# Patient Record
Sex: Male | Born: 1970 | Race: Black or African American | Hispanic: No | Marital: Married
Health system: Southern US, Community
[De-identification: ages and names within clinical notes are randomized; demographics above are authoritative.]

---

## 2014-01-15 ENCOUNTER — Inpatient Hospital Stay: Payer: Self-pay | Admitting: Internal Medicine

## 2014-01-15 LAB — COMPREHENSIVE METABOLIC PANEL
ALK PHOS: 193 U/L — AB
ANION GAP: 6 — AB (ref 7–16)
Albumin: 2.8 g/dL — ABNORMAL LOW (ref 3.4–5.0)
BUN: 12 mg/dL (ref 7–18)
Bilirubin,Total: 1.1 mg/dL — ABNORMAL HIGH (ref 0.2–1.0)
Calcium, Total: 8.7 mg/dL (ref 8.5–10.1)
Chloride: 92 mmol/L — ABNORMAL LOW (ref 98–107)
Co2: 27 mmol/L (ref 21–32)
Creatinine: 1.58 mg/dL — ABNORMAL HIGH (ref 0.60–1.30)
EGFR (African American): >60
EGFR (Non-African Amer.): 54 — ABNORMAL LOW
Glucose: 99 mg/dL (ref 65–99)
OSMOLALITY: 251 (ref 275–301)
Potassium: 5 mmol/L (ref 3.5–5.1)
SGOT(AST): 228 U/L — ABNORMAL HIGH (ref 15–37)
SGPT (ALT): 349 U/L — ABNORMAL HIGH (ref 12–78)
Sodium: 125 mmol/L — ABNORMAL LOW (ref 136–145)
TOTAL PROTEIN: 8.6 g/dL — AB (ref 6.4–8.2)

## 2014-01-15 LAB — CBC WITH DIFFERENTIAL/PLATELET
Basophil #: 0.1 10*3/uL (ref 0.0–0.1)
Basophil %: 0.7 %
Eosinophil #: 0.1 10*3/uL (ref 0.0–0.7)
Eosinophil %: 0.6 %
HCT: 39.7 % — AB (ref 40.0–52.0)
HGB: 12.9 g/dL — AB (ref 13.0–18.0)
Lymphocyte #: 1.6 10*3/uL (ref 1.0–3.6)
Lymphocyte %: 9.1 %
MCH: 29 pg (ref 26.0–34.0)
MCHC: 32.5 g/dL (ref 32.0–36.0)
MCV: 89 fL (ref 80–100)
MONOS PCT: 16.7 %
Monocyte #: 2.9 x10 3/mm — ABNORMAL HIGH (ref 0.2–1.0)
NEUTROS ABS: 12.4 10*3/uL — AB (ref 1.4–6.5)
NEUTROS PCT: 72.9 %
Platelet: 443 10*3/uL — ABNORMAL HIGH (ref 150–440)
RBC: 4.45 10*6/uL (ref 4.40–5.90)
RDW: 14.7 % — AB (ref 11.5–14.5)
WBC: 17.1 10*3/uL — ABNORMAL HIGH (ref 3.8–10.6)

## 2014-01-16 LAB — URINALYSIS, COMPLETE
BILIRUBIN, UR: NEGATIVE
Bacteria: NONE SEEN
Glucose,UR: NEGATIVE mg/dL (ref 0–75)
Ketone: NEGATIVE
Leukocyte Esterase: NEGATIVE
Nitrite: NEGATIVE
Ph: 7 (ref 4.5–8.0)
Protein: NEGATIVE
RBC,UR: 1 /HPF (ref 0–5)
Specific Gravity: 1.004 (ref 1.003–1.030)
Squamous Epithelial: NONE SEEN
WBC UR: 3 /HPF (ref 0–5)

## 2014-01-17 LAB — BASIC METABOLIC PANEL
ANION GAP: 3 — AB (ref 7–16)
BUN: 9 mg/dL (ref 7–18)
Calcium, Total: 8.4 mg/dL — ABNORMAL LOW (ref 8.5–10.1)
Chloride: 101 mmol/L (ref 98–107)
Co2: 25 mmol/L (ref 21–32)
Creatinine: 1.17 mg/dL (ref 0.60–1.30)
EGFR (African American): >60
Glucose: 96 mg/dL (ref 65–99)
OSMOLALITY: 257 (ref 275–301)
POTASSIUM: 5 mmol/L (ref 3.5–5.1)
SODIUM: 129 mmol/L — AB (ref 136–145)

## 2014-01-17 LAB — HEPATIC FUNCTION PANEL A (ARMC)
ALBUMIN: 2.4 g/dL — AB (ref 3.4–5.0)
ALK PHOS: 159 U/L — AB
BILIRUBIN DIRECT: 0.1 mg/dL (ref 0.00–0.20)
Bilirubin,Total: 0.5 mg/dL (ref 0.2–1.0)
SGOT(AST): 131 U/L — ABNORMAL HIGH (ref 15–37)
SGPT (ALT): 285 U/L — ABNORMAL HIGH (ref 12–78)
Total Protein: 6.9 g/dL (ref 6.4–8.2)

## 2014-01-20 LAB — CULTURE, BLOOD (SINGLE)

## 2014-11-21 NOTE — Discharge Summary (Signed)
PATIENT NAME:  Steven Swanson, Steven Swanson MR#:  [id number] DATE OF BIRTH:  08/22/1970  DATE OF ADMISSION:  01/15/2014 DATE OF DISCHARGE:  01/17/2014  ADMITTING PHYSICIAN:  Dr. Alan Ripperavid Howard.  DISCHARGING PHYSICIAN: Enid Baasadhika Kalisetti, MD.  PRIMARY CARE PHYSICIAN:  None.  CONSULTATIONS IN THE HOSPITAL: None.   DISCHARGE DIAGNOSES:  1.  Sepsis.  2.  Left-sided multilobar pneumonia.  3.  Transaminitis.  4.  Hyponatremia.  5.  Acute renal failure.  6.  Hypertension, transient, in the hospital.   DISCHARGE HOME MEDICATIONS:  Levaquin 500 mg p.o. daily for 8 days.   DISCHARGE DIET: Low-sodium diet.   DISCHARGE ACTIVITY: As tolerated.   FOLLOWUP INSTRUCTIONS:  1.  PCP follow-up in 1 week.  2.  LFTs follow in one week.   LABORATORY DATA AND IMAGING STUDIES PRIOR TO DISCHARGE: Sodium 129, potassium 5.0, chloride 101, bicarbonate 25, BUN 9, creatinine 1.17, glucose 96, and calcium of 8.4.   Total bilirubin 0.5, alkaline phosphatase 159, ALT 285, AST 131.   Ultrasound of the abdomen showing normal liver. No abnormalities.   Hepatitis panel negative. Blood cultures are negative.  Chest x-ray showing left-sided multilobar pneumonia. Normal heart size. No acute osseous abnormalities noted.   WBC on admission was 17.1, creatinine 1.58, sodium 125, chloride of 92.  Urinalysis negative for any infection.   BRIEF HOSPITAL COURSE:  1.  Steven Swanson is a 44 year old African American male with no significant past medical history, presented to the hospital secondary to fever, white count, and tachycardia.  He was noted to be septic secondary to multilobar pneumonia based on chest x-ray.  He was started on Rocephin and azithromycin. Blood cultures are negative. He has improved a lot. He is able to saturate well on room air, ambulate without any desaturations, and is being discharged home on Levaquin.  2.  Hyponatremia.  3.  Acute renal failure secondary to prerenal causes, dehydration, which improved a lot  since admission.  4.  Transaminitis, likely from underlying infection and fever. No cause has been found.  His LFTS are improving with improvement in his infection. The ultrasound showed normal liver and hepatitis panel is negative. He is advised to have follow-up of his liver tests in one week.  5.  His course has been, otherwise, uneventful in the hospital.   DISCHARGE CONDITION: Stable.   DISCHARGE DISPOSITION: Home.  TIME SPENT ON DISCHARGE: 40 minutes.    ____________________________ Enid Baasadhika Kalisetti, MD rk:ts D: 01/17/2014 12:44:33 ET T: 01/17/2014 13:25:37 ET JOB#: [id number]  cc: Enid Baasadhika Kalisetti, MD, <Dictator> Enid BaasADHIKA KALISETTI MD ELECTRONICALLY SIGNED 01/19/2014 16:14

## 2014-11-21 NOTE — H&P (Signed)
PATIENT NAME:  Steven Swanson, Steven Swanson MR#:  [id number] DATE OF BIRTH:  10-17-1970  DATE OF ADMISSION:  01/15/2014  REFERRING PHYSICIAN:  Dr. Jimmye Norman.  PRIMARY CARE PHYSICIAN:  None.   CHIEF COMPLAINT:  Fever.   HISTORY OF PRESENT ILLNESS:  A 44 year old African American gentleman without significant past medical history presenting with fever.  He states that he has about one week duration of fever, chills.  He said originally at the beginning of the week he had an ear infection due to left ear pain which he described as achy.  Denies any tinnitus, changes in hearing or discharge.  He took some amoxicillin that "he had laying around."  His ear symptoms have resolved, however his fever, chills, did not improve.  He now has associated fatigue and weakness as well as noting an associated cough for the last 3 to 4 days duration which is productive in nature of yellowish sputum.  Denies any sick contacts.  Denies any chest pain, palpitations, or shortness of breath.  Denies any further symptomatology.   REVIEW OF SYSTEMS:  CONSTITUTIONAL:  Positive for fevers, chills, fatigue, weakness.  EYES:  Denies blurred vision, double vision, eye pain.  EARS, NOSE, THROAT:  Denies tinnitus or hearing loss.  Positive for ear pain as described above.  Denies any discharge.  RESPIRATORY:  Positive for cough as described above.  Denies any wheezing, hemoptysis or shortness of breath.  CARDIOVASCULAR:  Denies any chest pain, palpitations, edema.  GASTROINTESTINAL:  Denies nausea, vomiting, diarrhea, abdominal pain.  GENITOURINARY:  Denies dysuria or hematuria.  ENDOCRINE:  Denies nocturia or thyroid problems.  HEMATOLOGIC AND LYMPHATIC:  Denies easy bruising or bleeding.  SKIN:  Denies rashes or lesions.  MUSCULOSKELETAL:  Denies pain in neck, back, shoulder, knees, hips or arthritic symptoms.  NEUROLOGIC:  Denies any paralysis or paresthesias.  PSYCHIATRIC:  Denies anxiety or depressive symptoms.  Otherwise, full  review of systems performed by me is negative.   PAST MEDICAL HISTORY:  No prior medical conditions.   SOCIAL HISTORY:  Positive for tobacco use as well as occasional alcohol use.  Denies any drug usage.   FAMILY HISTORY:  Denies any known cardiovascular or pulmonary disorders.   ALLERGIES:  No known drug allergies.   HOME MEDICATIONS:  None.  He takes no over-the-counter medications as well.   PHYSICAL EXAMINATION: VITAL SIGNS:  Temperature 103.1 degrees Fahrenheit, heart rate 107, respirations 22, blood pressure 128/74, saturating 97% on room air.  Weight 90.7 kg, BMI 25.  GENERAL:  Well-nourished, well-developed, African American gentleman, who is ill-appearing.  HEAD:  Normocephalic, atraumatic.  EYES:  Pupils equal, round, reactive to light.  Extraocular muscles intact.  No scleral icterus.  MOUTH:  Moist mucosal membranes.  Dentition intact.  No abscess noted.  EAR, NOSE, THROAT:  Clear without exudates.  Tympanic membranes are not visible secondary to impaction.  There is no erythema as noted.  No discharge.  No external lesions.  NECK:  Supple.  No thyromegaly.  No nodules.  No JVD.  PULMONARY:  Scant coarse breath sounds over the left lung base, otherwise clear to auscultation bilaterally without wheezes, rales, or rhonchi.  No use of accessory muscles.  Good respiratory effort.  CHEST:  Nontender to palpation.  CARDIOVASCULAR:  S1, S2, tachycardic.  No murmurs, rubs, or gallops.  No edema.  Pedal pulses 2+ bilaterally.  GASTROINTESTINAL:  Soft, nontender, nondistended.  No masses.  Positive bowel sounds.  No hepatosplenomegaly.  Murphy sign is negative. MUSCULOSKELETAL:  No  swelling, clubbing, or edema.  Range of motion full in all extremities.  NEUROLOGIC:  Cranial nerves II through XII intact.  No gross focal neurological deficits.  Sensation intact.  Reflexes intact.  SKIN:  No ulcerations, lesions, rashes, or cyanosis.  Skin warm and dry.  Turgor intact.  PSYCHIATRIC:  Mood  and affect within normal limits.  The patient is awake, alert, oriented x 3.  Insight and judgment intact.   LABORATORY DATA:  Sodium 125, potassium 5, chloride 92, bicarb 27, BUN 12, creatinine 1.58, glucose 99.  LFTs:  Total protein 8.6, albumin 2.8, bilirubin 1.1, alk phos 193, AST 228, ALT 349, WBC 17.1, hemoglobin 12.9, platelets of 443.  He had a chest x-ray performed revealing a left-sided multifocal pneumonia.   ASSESSMENT AND PLAN:  A 44 year old African American gentleman presenting with fever.   1.  Sepsis:  Meeting septic criteria by heart rate, respiratory rate, temperature and leukocytosis (on arrival).  Sepsis secondary to community-acquired pneumonia, panculture including blood and urine, antibiotic coverage with ceftriaxone and azithromycin, intravenous fluid hydration to keep mean arterial pressure greater than 65.  Provide DuoNeb treatments q. 4 hours as well as supplemental oxygen to keep oxygen saturation greater than 92%.  2.  Hyponatremia with a 544.2 mEq sodium deficit to correct his sodium from 125 to 135 in approximately 24 hours.  This would be normal saline 100 mL/hr.  3.  Transaminitis, unknown baseline.  He is asymptomatic at this time.  Does take occasional alcohol intake.  Denies any drug usage or high risk behavior for human immunodeficiency virus.  We will check an abdominal ultrasound to further work up his laboratory abnormalities.  4.  Acute kidney injury.  Once again, unknown baseline.  Provide intravenous fluid hydration.  Follow urine output, renal function as he does have a urinalysis that is pending at this time and his urinalysis will also check for protein given he does have hypoalbuminemia.  5.  Venous thromboembolism prophylaxis with heparin subQ.  6.  CODE STATUS:  THE PATIENT IS FULL CODE.   TIME SPENT:  45 minutes.     ____________________________ Aaron Mose. Hower, MD dkh:ea D: 01/16/2014 00:00:42 ET T: 01/16/2014 00:42:01  ET JOB#: [id number]  cc: Aaron Mose. Hower, MD, <Dictator> DAVID Woodfin Ganja MD ELECTRONICALLY SIGNED 01/16/2014 4:09

## 2016-02-10 IMAGING — US ABDOMEN ULTRASOUND LIMITED
1 series · 14 of 25 positions shown · non-contrast
Comparison: None.

CLINICAL DATA: Fever.  Elevated liver function tests.

EXAM:
US ABDOMEN LIMITED - RIGHT UPPER QUADRANT

[Series 1: abdomen ultrasound limited · 0.21mm/px · 14 of 40 slices shown]
[im 1/40]
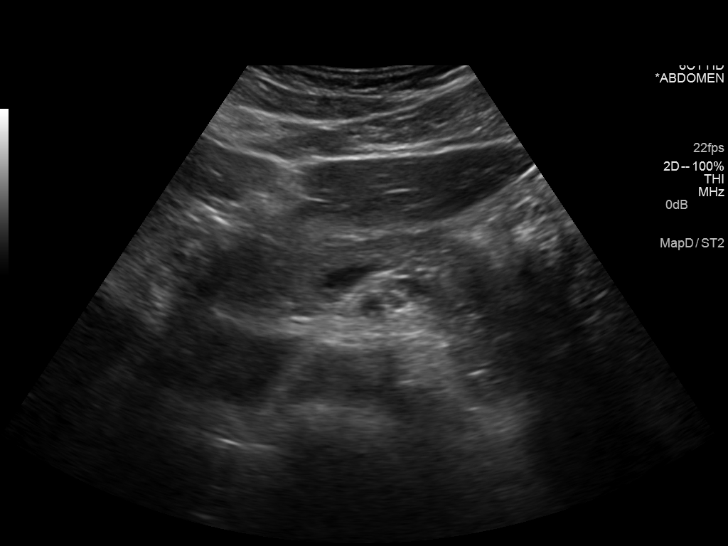
[im 4/40]
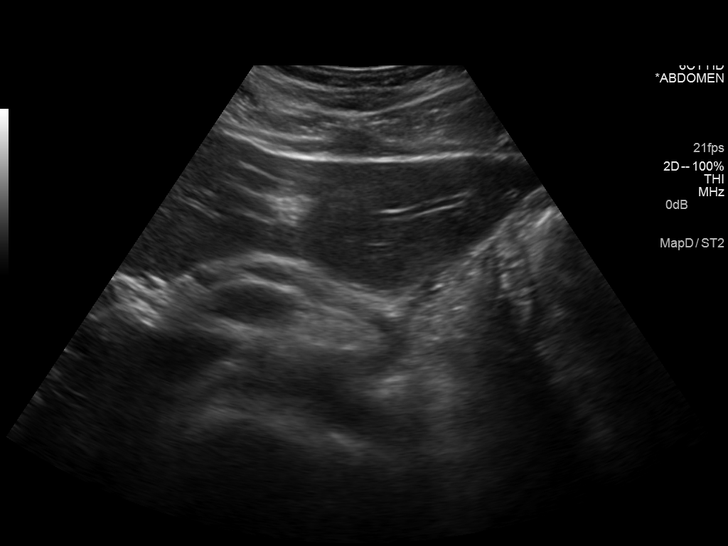
[im 7/40]
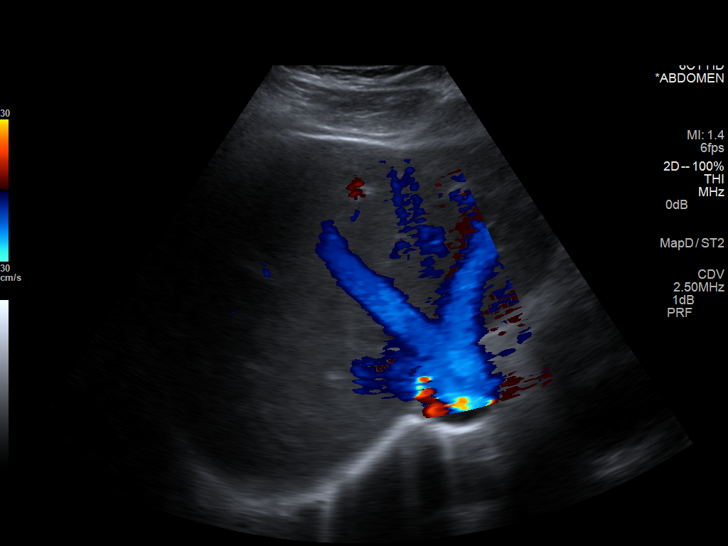
[im 10/40]
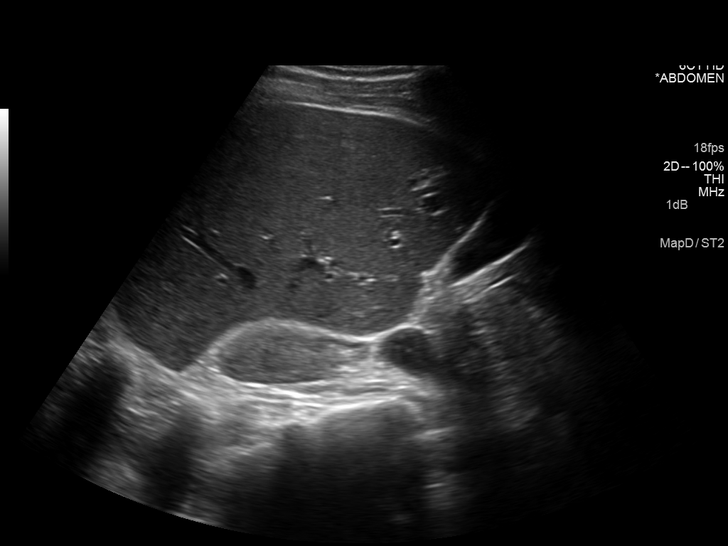
[im 14/40]
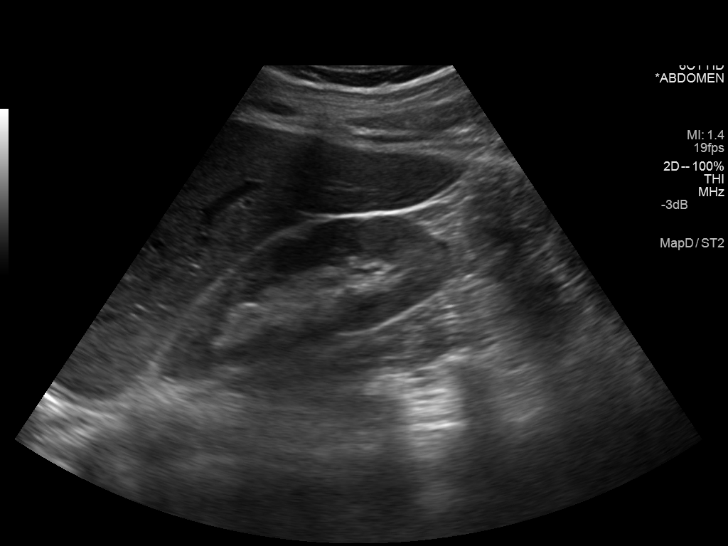
[im 15/40]
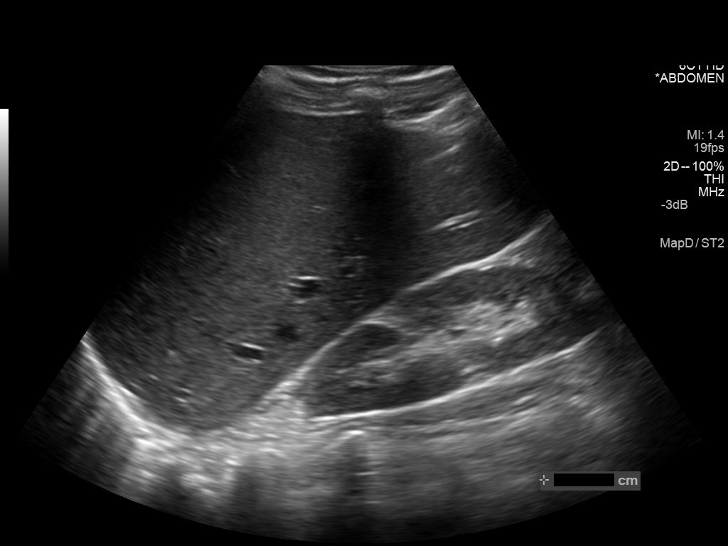
[im 18/40]
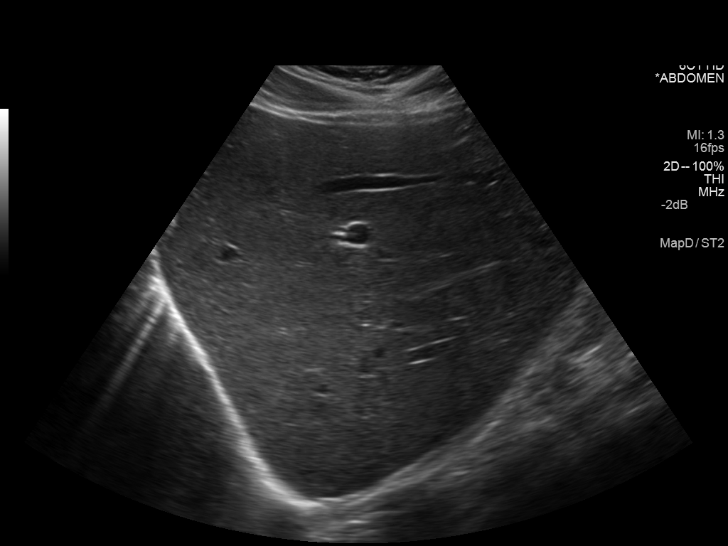
[im 22/40]
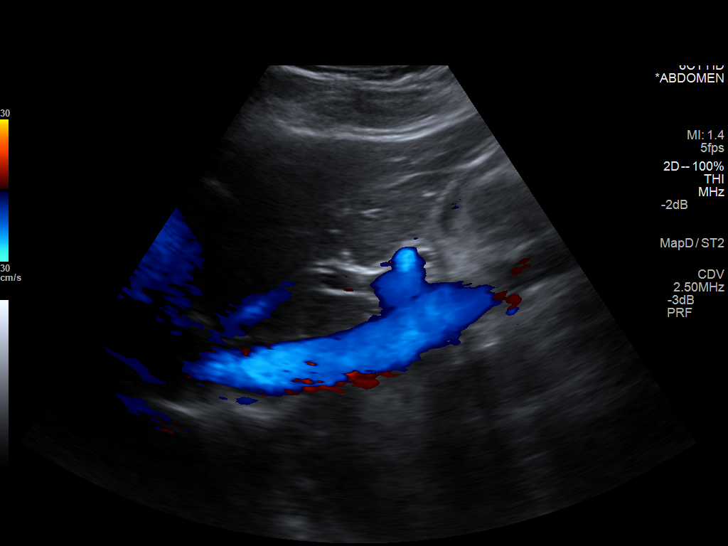
[im 25/40]
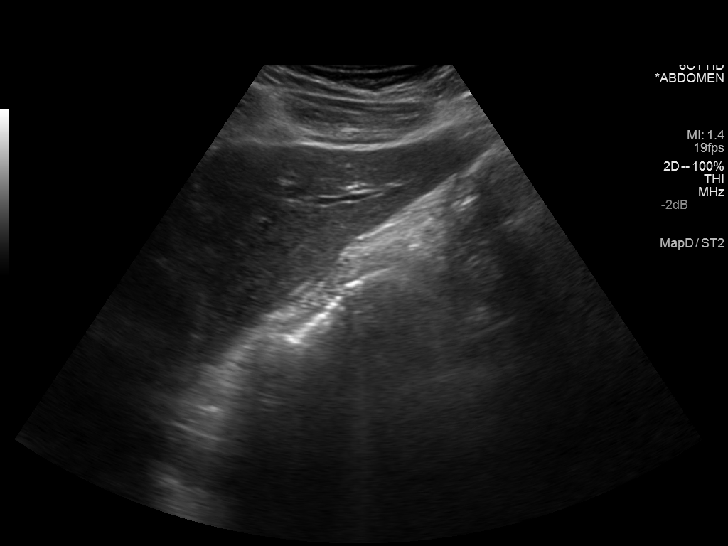
[im 27/40]
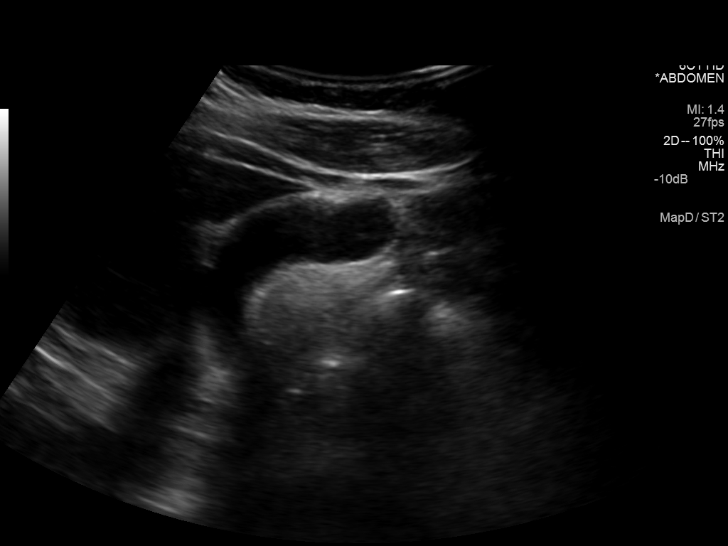
[im 30/40]
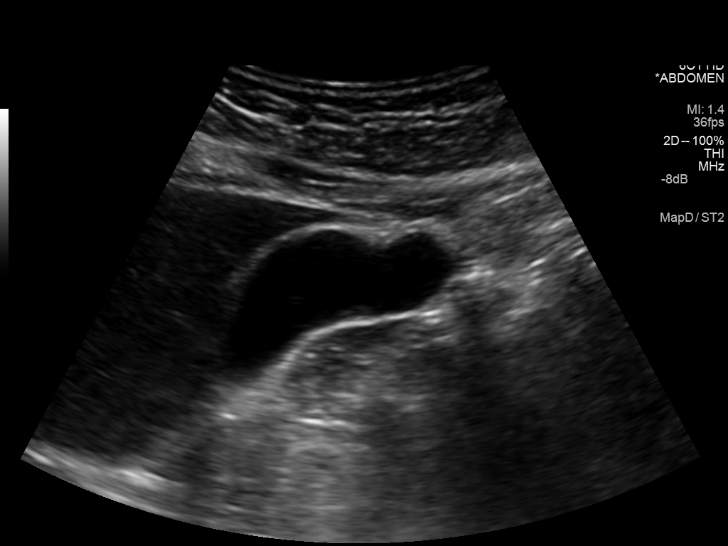
[im 33/40]
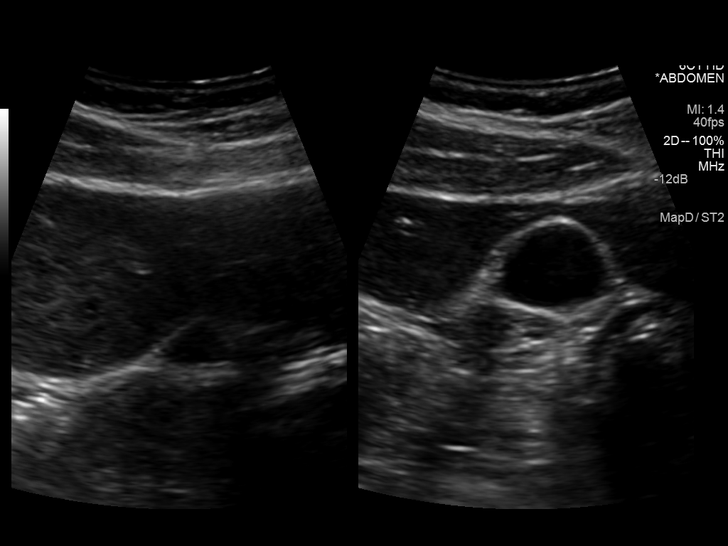
[im 36/40]
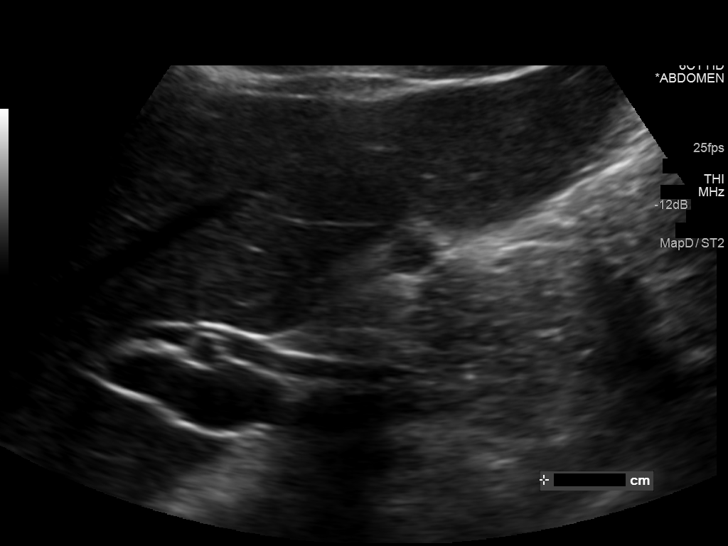
[im 40/40]
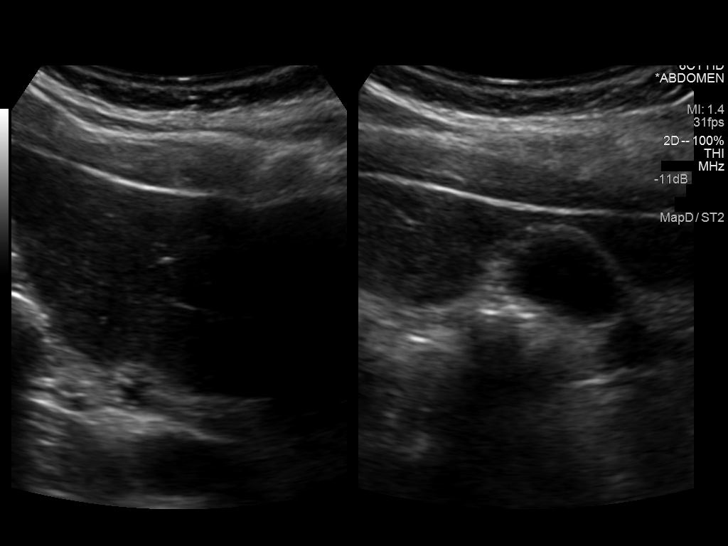

[14 of 25 positions shown; findings below may reference images not displayed]

FINDINGS: Gallbladder:

No gallstones or wall thickening visualized. No sonographic Murphy
sign noted.

Common bile duct:

Diameter: 4 mm, normal.

Liver:

No focal lesion identified. Within normal limits in parenchymal
echogenicity.
IMPRESSION: Normal.

## 2016-02-10 IMAGING — CR DG CHEST 2V
1 series · 2 of 2 positions shown · non-contrast
Comparison: None.

CLINICAL DATA: Fever.  Intermittent cough.

EXAM:
CHEST  2 VIEW

[Series 1: w chest pa · 0.14mm/px · 2 of 2 slices shown]
[im 1/2]
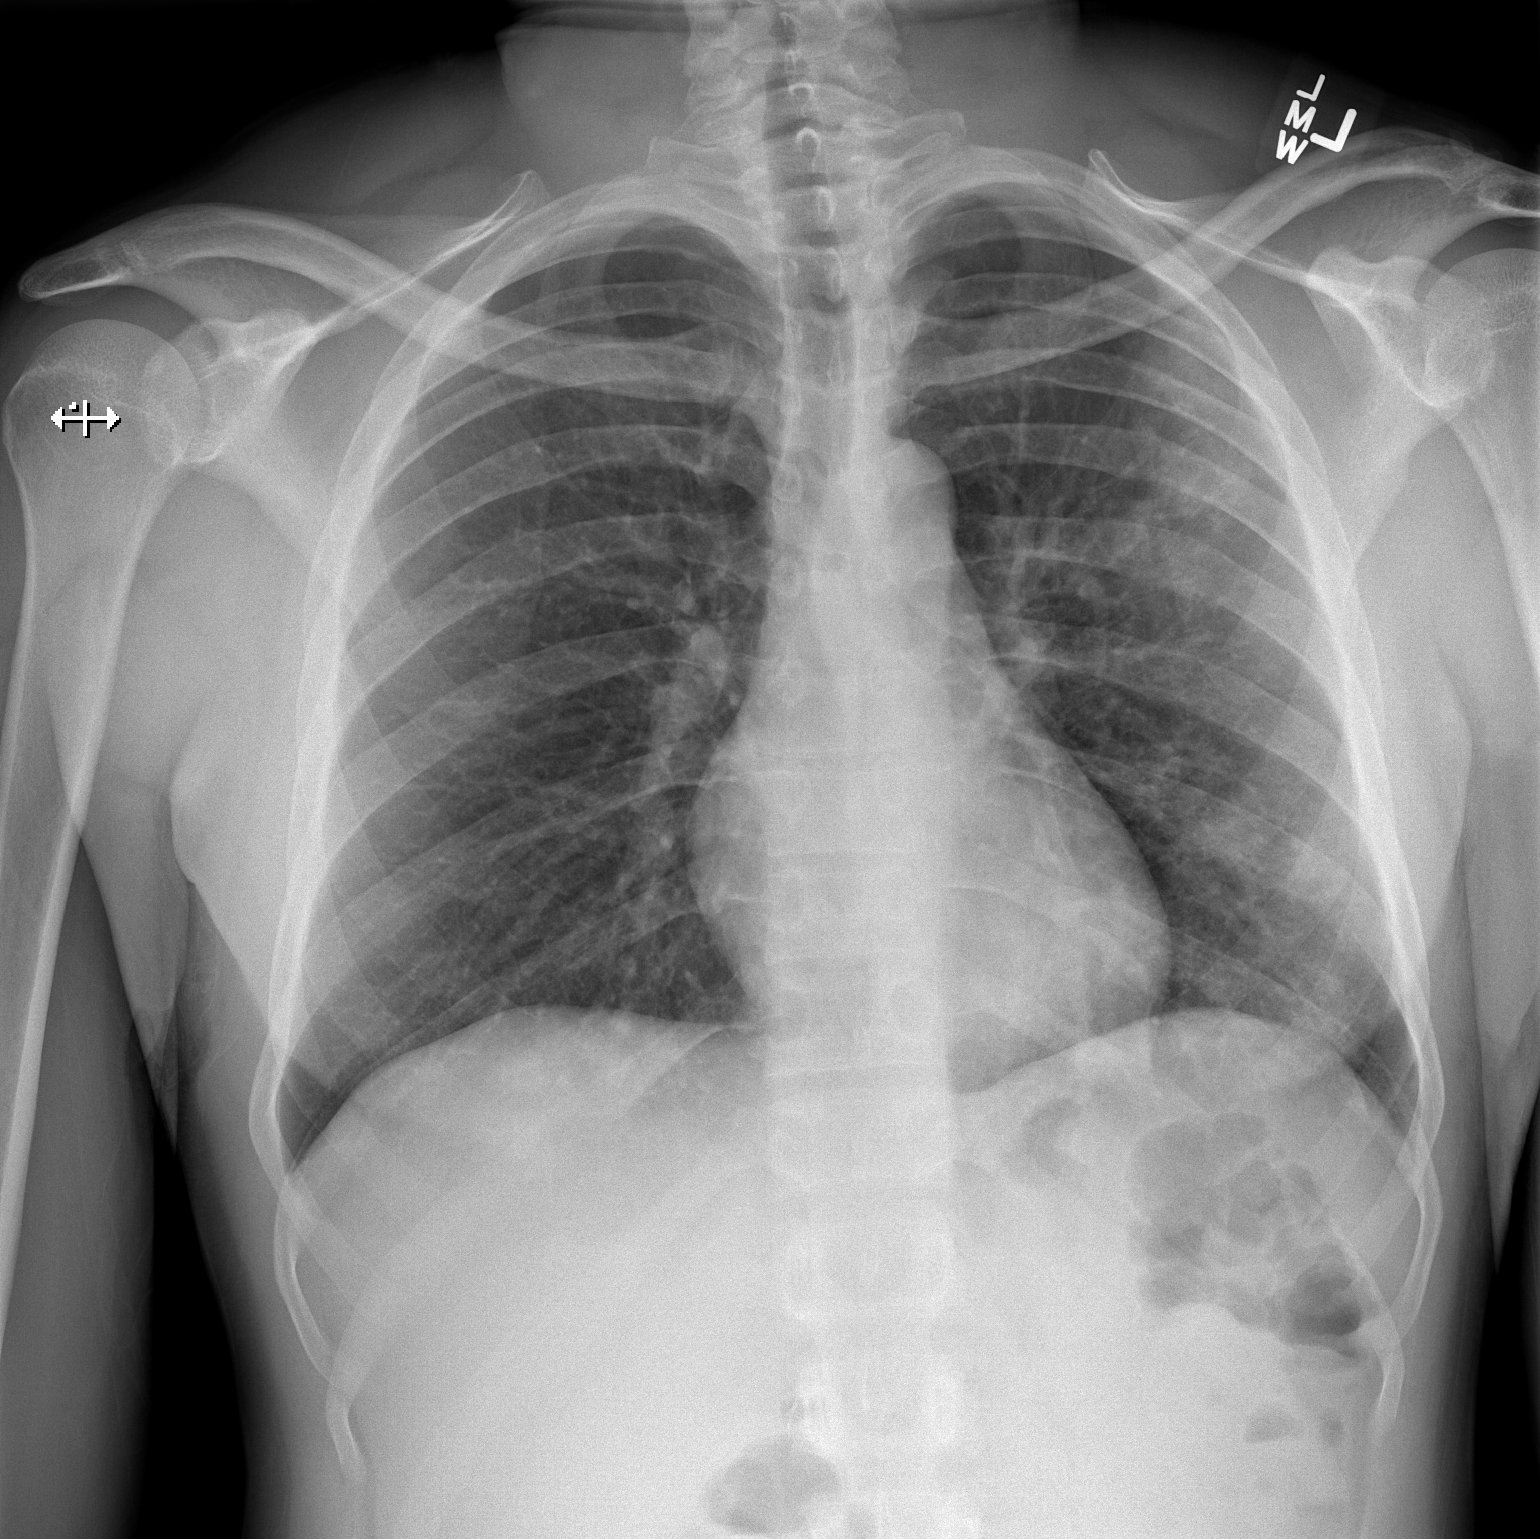
[im 2/2]
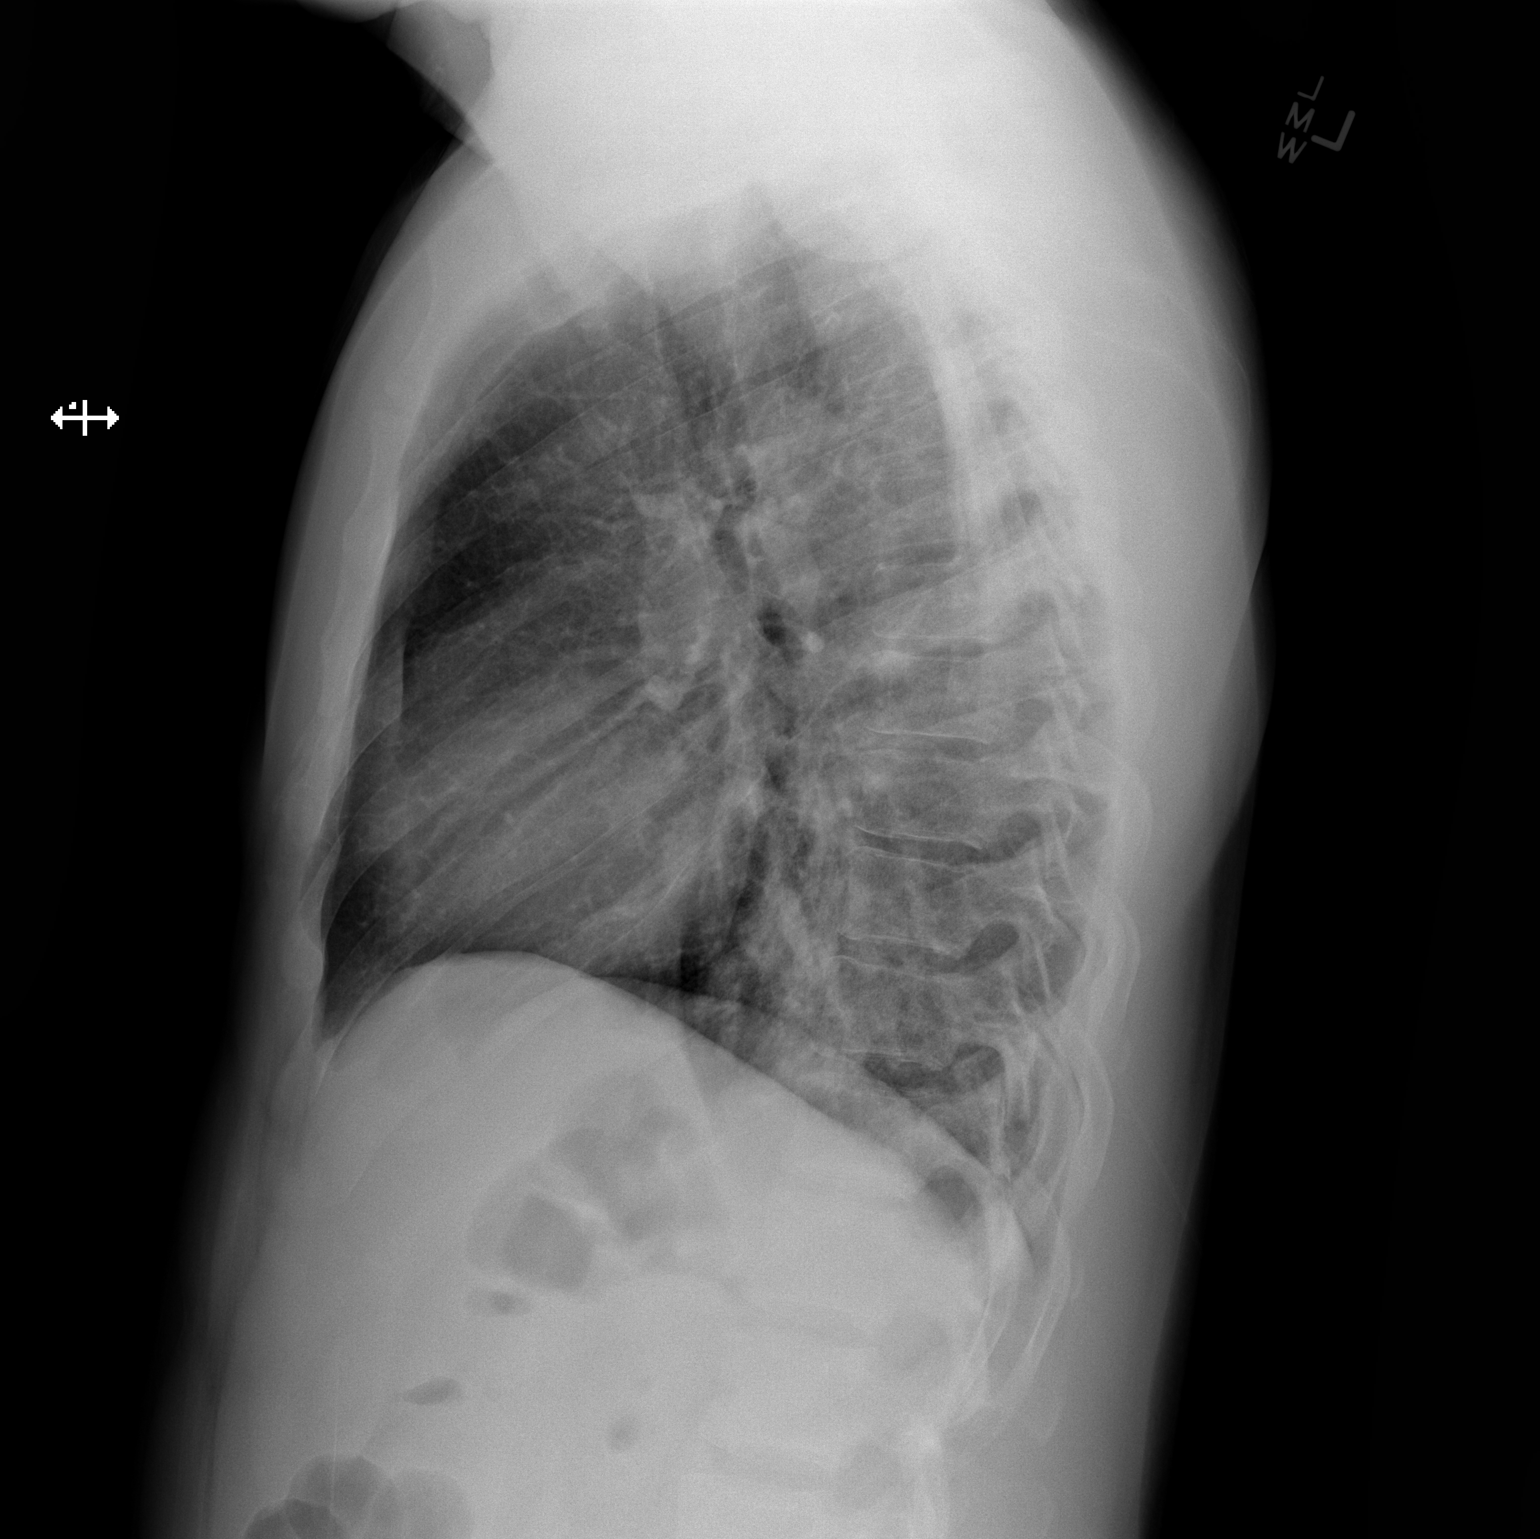

[2 of 2 positions shown; findings below may reference images not displayed]

FINDINGS: Patchy left-sided airspace opacities are noted, compatible with
multifocal pneumonia. There may also be minimal right basilar
opacity, projecting over the right hemidiaphragm. No pleural
effusion or pneumothorax is seen.

The heart is normal in size. No acute osseous abnormalities are
identified.
IMPRESSION: Left-sided multifocal pneumonia noted.
